# Patient Record
Sex: Male | Born: 1965 | Race: White | Hispanic: No | Marital: Married | Smoking: Never smoker
Health system: Southern US, Community
[De-identification: ages and names within clinical notes are randomized; demographics above are authoritative.]

## PROBLEM LIST (undated history)

## (undated) DIAGNOSIS — I10 Essential (primary) hypertension: Secondary | ICD-10-CM

## (undated) DIAGNOSIS — K219 Gastro-esophageal reflux disease without esophagitis: Secondary | ICD-10-CM

## (undated) DIAGNOSIS — E119 Type 2 diabetes mellitus without complications: Secondary | ICD-10-CM

## (undated) DIAGNOSIS — E785 Hyperlipidemia, unspecified: Secondary | ICD-10-CM

## (undated) DIAGNOSIS — J302 Other seasonal allergic rhinitis: Secondary | ICD-10-CM

## (undated) DIAGNOSIS — G4733 Obstructive sleep apnea (adult) (pediatric): Secondary | ICD-10-CM

## (undated) HISTORY — DX: Morbid (severe) obesity due to excess calories: E66.01

## (undated) HISTORY — PX: NASAL SEPTUM SURGERY: SHX37

## (undated) HISTORY — DX: Other seasonal allergic rhinitis: J30.2

## (undated) HISTORY — PX: OTHER SURGICAL HISTORY: SHX169

## (undated) HISTORY — DX: Hyperlipidemia, unspecified: E78.5

## (undated) HISTORY — DX: Gastro-esophageal reflux disease without esophagitis: K21.9

## (undated) HISTORY — DX: Obstructive sleep apnea (adult) (pediatric): G47.33

## (undated) HISTORY — DX: Type 2 diabetes mellitus without complications: E11.9

## (undated) HISTORY — PX: CHOLECYSTECTOMY: SHX55

## (undated) HISTORY — DX: Essential (primary) hypertension: I10

---

## 2000-04-17 ENCOUNTER — Encounter: Payer: Self-pay | Admitting: Orthopedic Surgery

## 2000-04-17 ENCOUNTER — Emergency Department (HOSPITAL_COMMUNITY): Admission: EM | Admit: 2000-04-17 | Discharge: 2000-04-17 | Payer: Self-pay | Admitting: Emergency Medicine

## 2004-10-29 ENCOUNTER — Inpatient Hospital Stay: Payer: Self-pay | Admitting: Otolaryngology

## 2005-04-21 ENCOUNTER — Ambulatory Visit: Payer: Self-pay | Admitting: Otolaryngology

## 2008-03-07 ENCOUNTER — Emergency Department (HOSPITAL_COMMUNITY): Admission: EM | Admit: 2008-03-07 | Discharge: 2008-03-07 | Payer: Self-pay | Admitting: Emergency Medicine

## 2008-05-14 ENCOUNTER — Emergency Department (HOSPITAL_COMMUNITY): Admission: EM | Admit: 2008-05-14 | Discharge: 2008-05-14 | Payer: Self-pay | Admitting: Emergency Medicine

## 2008-09-11 ENCOUNTER — Encounter (INDEPENDENT_AMBULATORY_CARE_PROVIDER_SITE_OTHER): Payer: Self-pay | Admitting: General Surgery

## 2008-09-11 ENCOUNTER — Ambulatory Visit (HOSPITAL_COMMUNITY): Admission: RE | Admit: 2008-09-11 | Discharge: 2008-09-12 | Payer: Self-pay | Admitting: General Surgery

## 2009-10-01 IMAGING — CR DG CHEST 1V PORT
1 series · 1 of 1 positions shown · non-contrast
Comparison: None

CLINICAL DATA: Chest pain.

PORTABLE CHEST - 1 VIEW

[view not recorded]
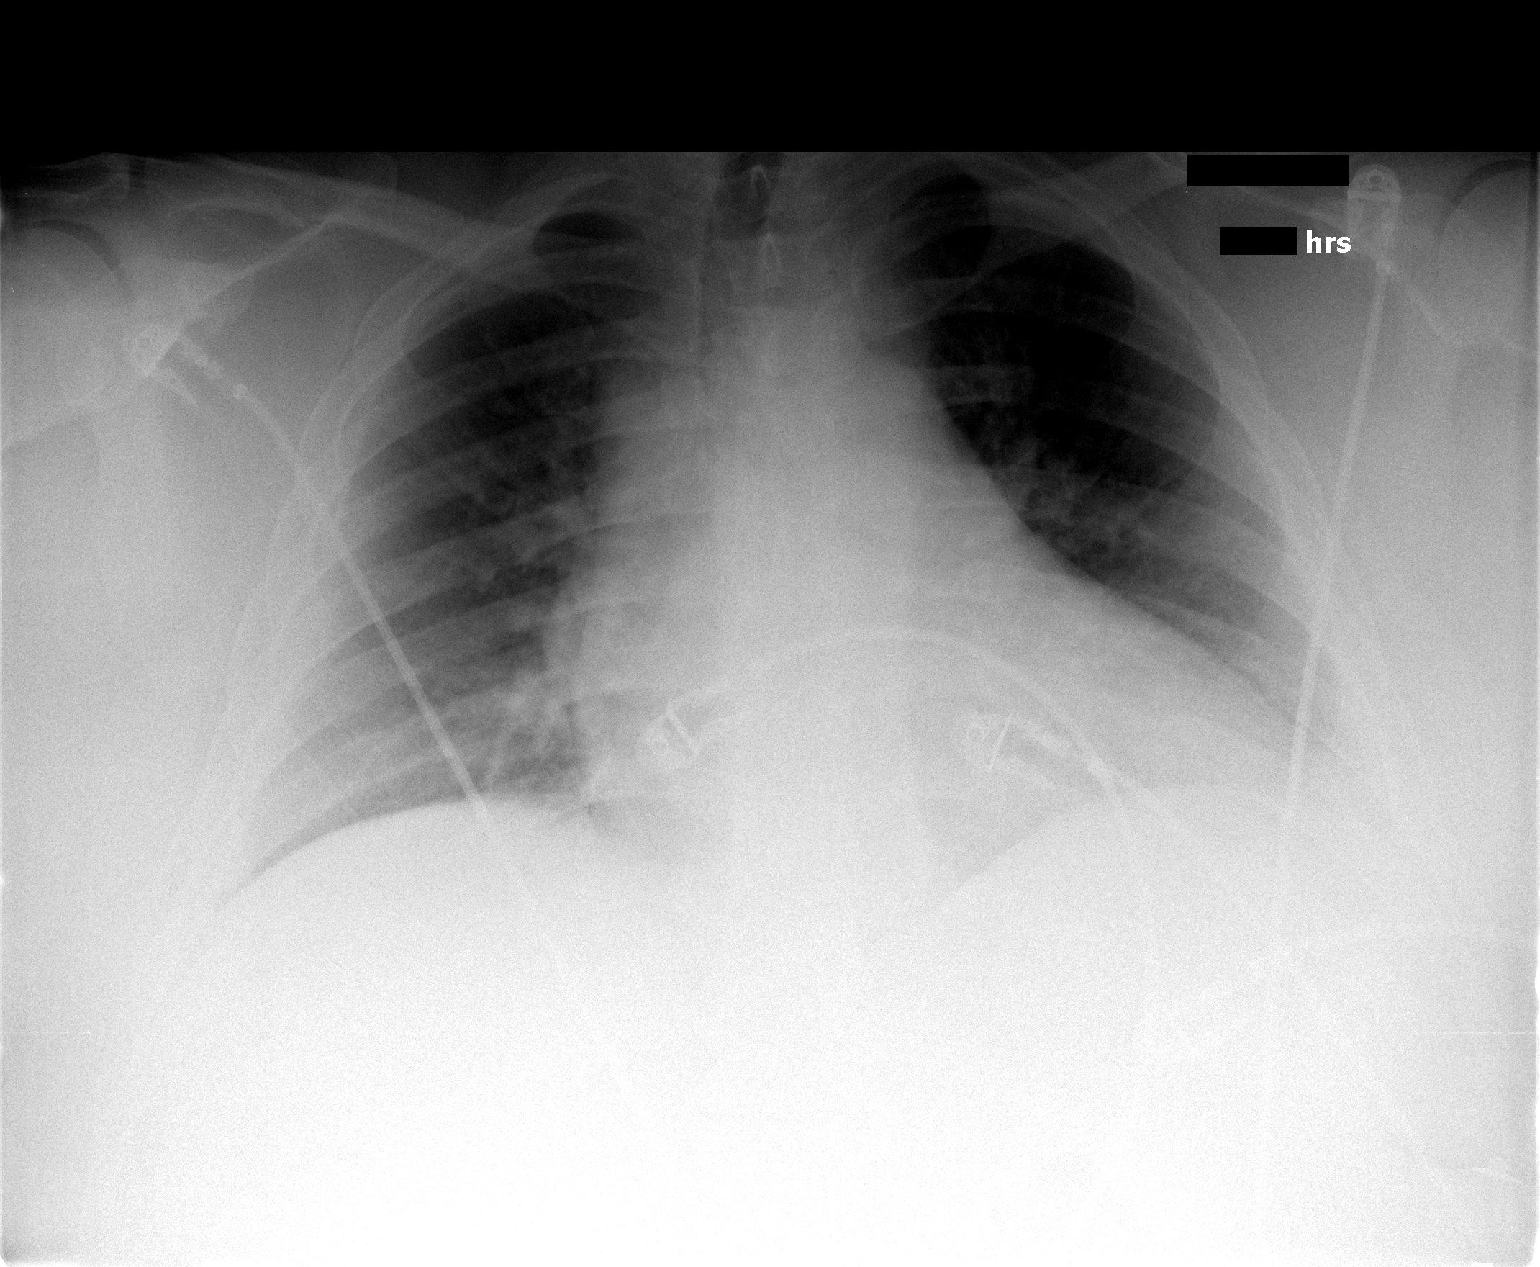

[1 of 1 positions shown; findings below may reference images not displayed]

FINDINGS: Trachea is midline.  Heart size is accentuated by low
lung volumes and AP technique.  Lungs are clear.
IMPRESSION: No acute findings.

## 2009-12-08 IMAGING — US US ABDOMEN COMPLETE
1 series · 14 of 25 positions shown · non-contrast
Comparison: None

CLINICAL DATA: Chest and abdomen pain.  Nausea.

ABDOMEN ULTRASOUND
TECHNIQUE: Complete abdominal ultrasound examination was performed
including evaluation of the liver, gallbladder, bile ducts,
pancreas, kidneys, spleen, IVC, and abdominal aorta.

[Series 1: unknown · 0.37mm/px · 14 of 66 slices shown]
[im 1/66]
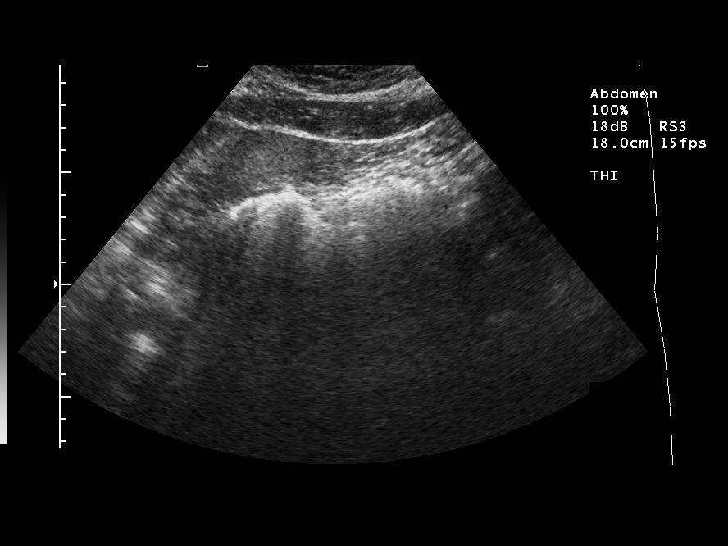
[im 6/66]
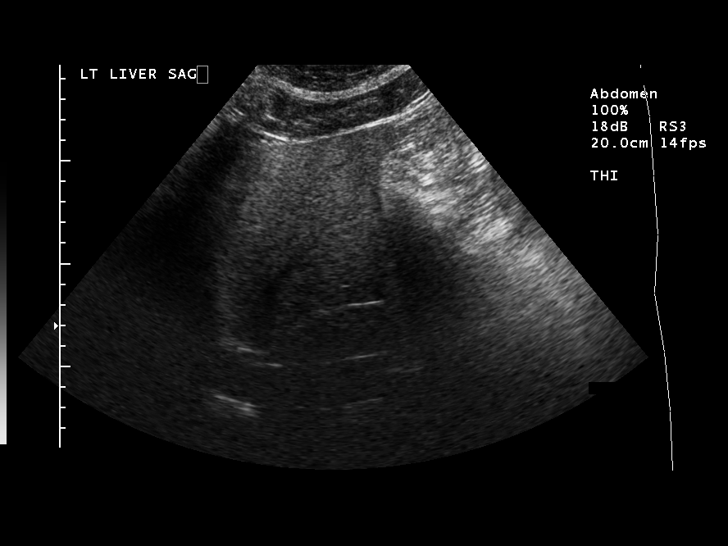
[im 11/66]
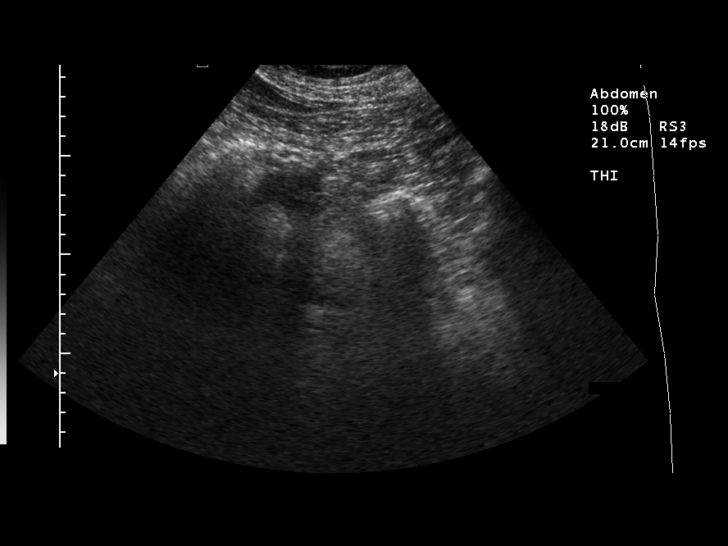
[im 17/66]
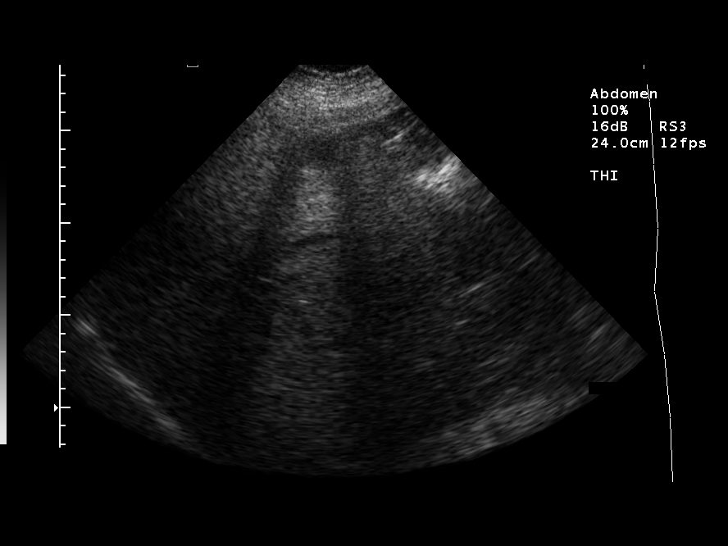
[im 22/66]
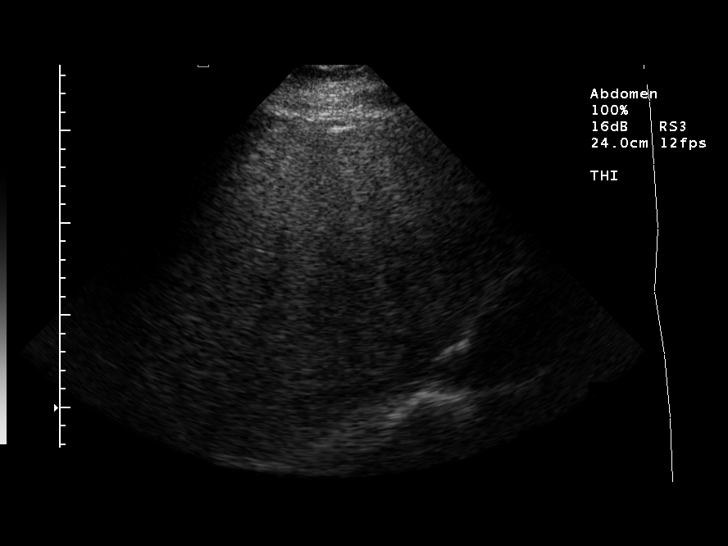
[im 25/66]
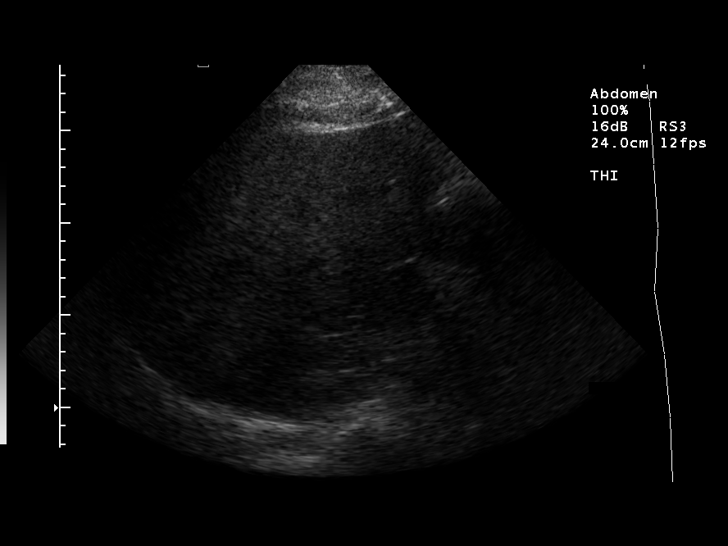
[im 30/66]
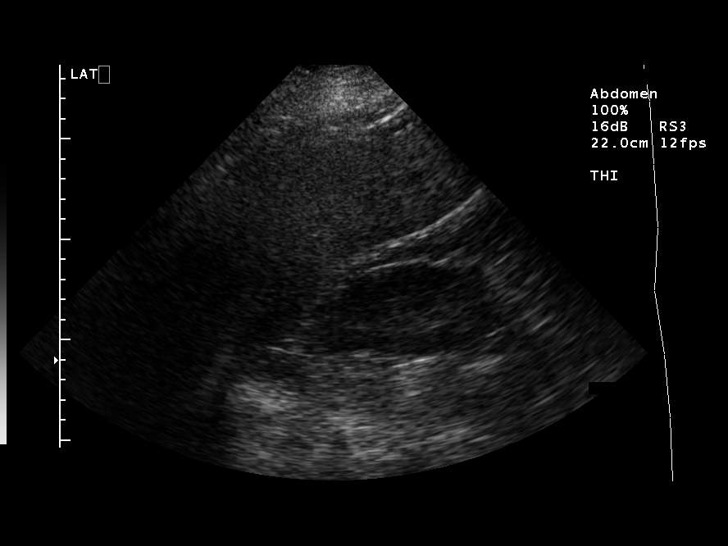
[im 36/66]
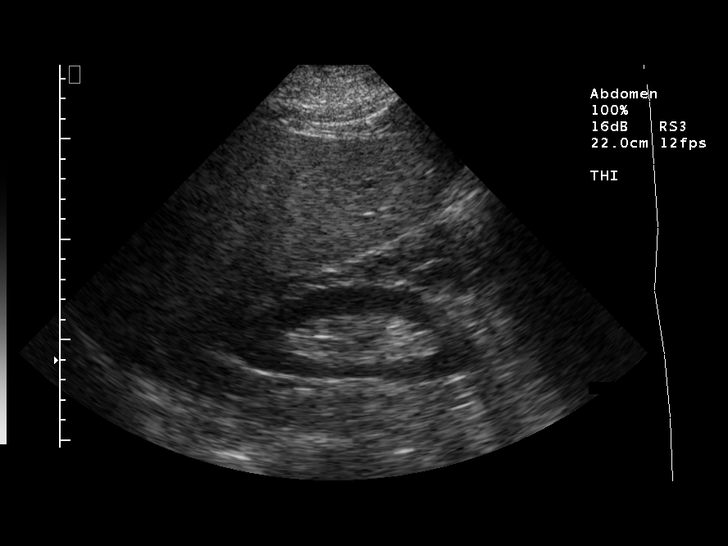
[im 41/66]
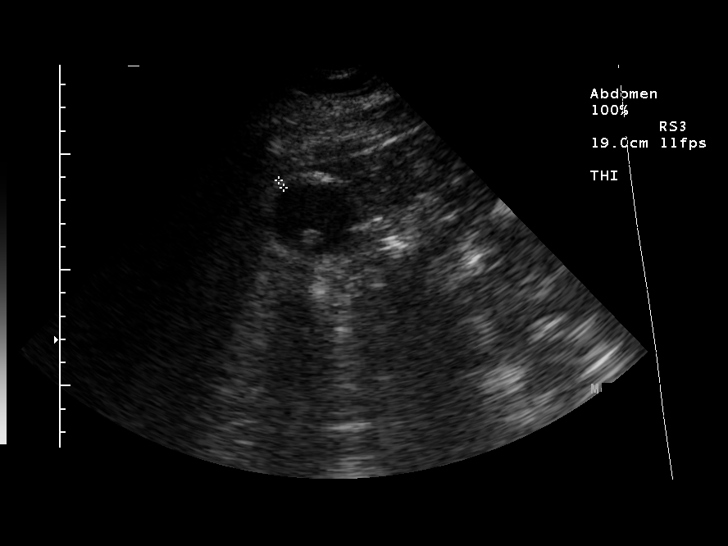
[im 44/66]
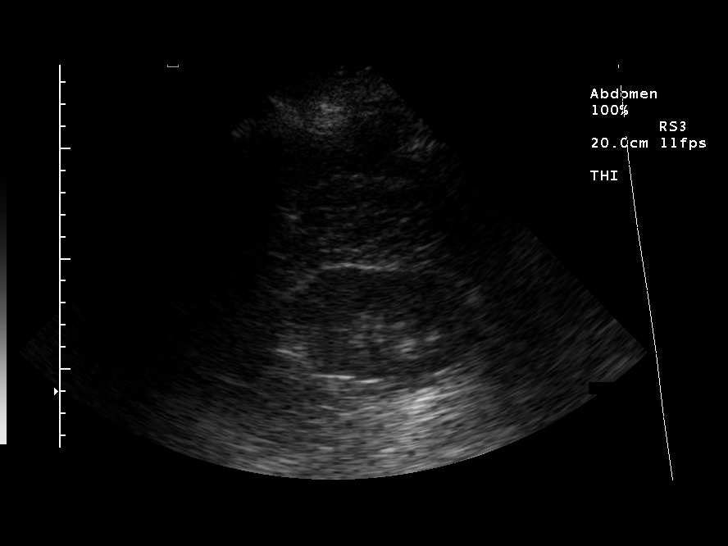
[im 49/66]
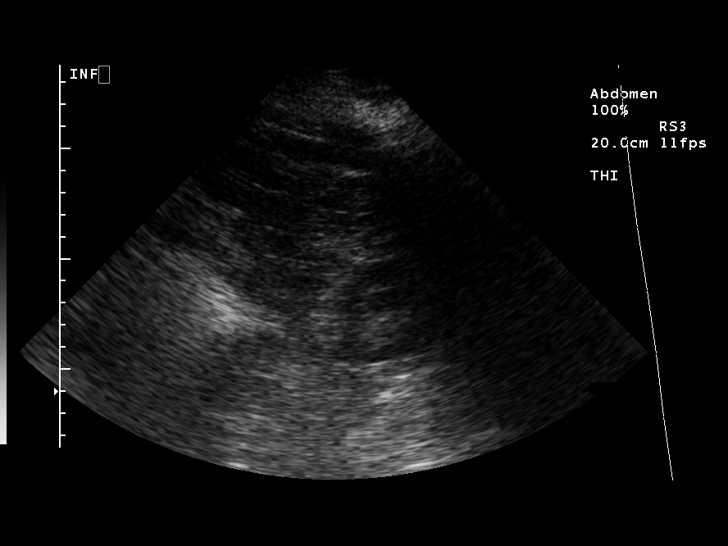
[im 55/66]
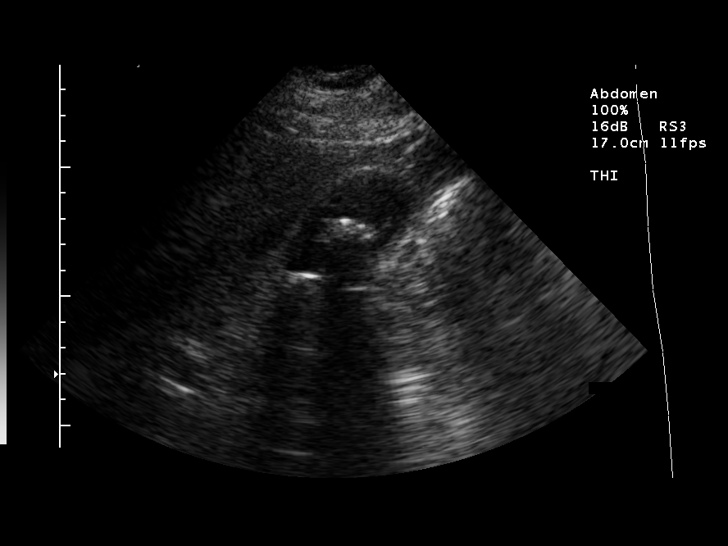
[im 60/66]
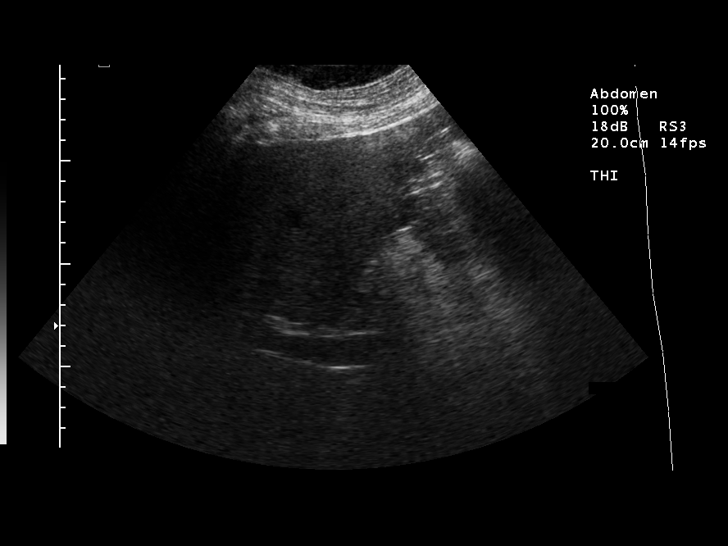
[im 66/66]
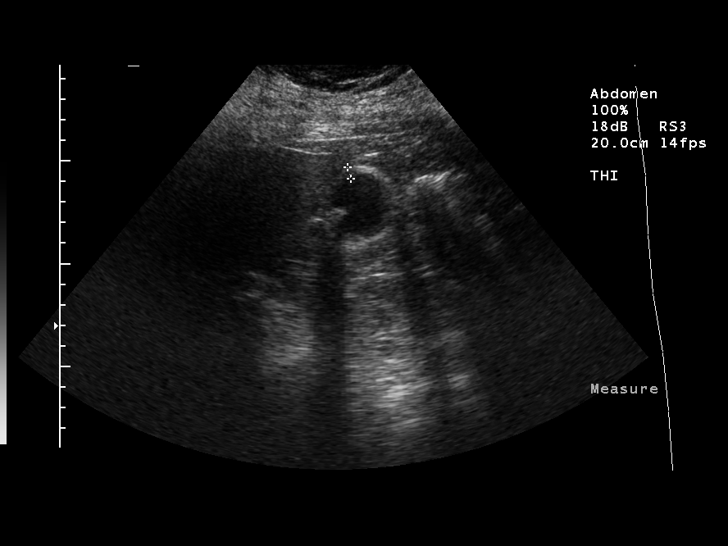

[14 of 25 positions shown; findings below may reference images not displayed]

FINDINGS: There are multiple gallstones.  The gallbladder wall is
slightly thickened to 6 mm.  No pericholecystic fluid.  No dilated
bile ducts.  The proximal common bile duct is 3.8 mm in diameter.

There is increased echogenicity of the liver parenchyma consistent
with fatty infiltration and there is mild hepatomegaly.

The inferior vena cava, spleen, and kidneys are normal.  Right
kidney is 11.9 cm in length and the left kidney is 12.0 cm in
length.

The pancreas and aorta are obscured by bowel gas.
IMPRESSION: Multiple gallstones with slight thickening of the gallbladder wall.
This could be seen with acute or chronic cholecystitis.  Mild
hepatomegaly  with a fatty liver.

## 2011-03-31 NOTE — Op Note (Signed)
NAMEJERRID, Hector Powell          ACCOUNT NO.:  [id number]   MEDICAL RECORD NO.:  [id number]          PATIENT TYPE:  OIB   LOCATION:  5032                         FACILITY:  [facility]   PHYSICIAN:  Cherylynn Ridges, M.D.    DATE OF BIRTH:  08-Oct-1966   DATE OF PROCEDURE:  09/11/2008  DATE OF DISCHARGE:                               OPERATIVE REPORT   PREOPERATIVE DIAGNOSIS:  Symptomatic cholelithiasis.   POSTOPERATIVE DIAGNOSIS:  Symptomatic cholelithiasis.   PROCEDURE:  Laparoscopic cholecystectomy with cholangiogram.   SURGEON:  Cherylynn Ridges, MD   ASSISTANT:  Currie Paris, MD   ANESTHESIA:  General endotracheal.   ESTIMATED BLOOD LOSS:  Less than 20 mL.   COMPLICATIONS:  None.   CONDITION:  Stable.   FINDINGS:  Two large stones in the gallbladder with normal  intraoperative cholangiogram.   INDICATIONS FOR OPERATION:  The patient is a 45 year old with  symptomatic gallstones who comes in for an elective laparoscopic  cholecystectomy.   OPERATION:  The patient was taken to the operating room and placed on  table in supine position.  After an adequate general endotracheal  anesthetic was administered, he was prepped and draped in the usual  sterile manner exposing the midline.   A supraumbilical midline incision longitudinally was made using a 15  blade and taken down to the midline fascia.  We incised the midline  fascia with a 15 blade and immediately went to the peritoneal cavity  with minimal difficulty.  We grabbed the edges with Kocher clamps and  passed a pursestring suture of 0 Vicryl around the fascial opening.  Then  secured in Hasson cannula which was subsequently passed and  secured in place with a pursestring suture.   A large and long instruments were used because of the patient's large  size; however, these were likely not needed as the patient, although in  spite of his large size was not difficult and not very thick.   With the patient's Hasson  cannula in place insufflated carbon dioxide  gas up to maximal intra-abdominal pressure of 15  mmHg.  He was placed  in reverse Trendelenburg, the left side was tilted down.  Two right-  sided, 5-mm cannulas and a subxiphoid 12-mm cannula was passed under  direct vision.  With all cannulas in place, the dissection was begun.   We grabbed the dome of the gallbladder using a ratcheted grasper through  the lateral most 5-mm cannula and retracted towards the anterior  abdominal wall in the right upper quadrant.  A second grasper was passed  onto the infundibulum.  We opened up the peritoneum overlying the  triangle of Fallot and hepatic duodenal triangle, isolating out the  cystic duct and cystic artery.  The cystic artery was clipped proximally  and distally x2 and then transected.  A clip was placed along the  gallbladder side of the cystic duct and a choledochotomy made using  laparoscopic scissors.  A Cook catheter was passed through the anterior  abdominal wall and then into the choledochotomy and secured in place  with a clip.  A cholangiogram was performed which  showed good flow into  the duodenum, good proximal filling, no intraductal filling defects, and  no dilatation.   The clip was removed from the catheter, the catheter was removed, and  the distal cystic duct was clipped x3 and then transcystic duct  transected.   We dissected out the gallbladder from its bed with minimal difficulty.  There was minimal bleeding.  We removed it from the supraumbilical site  using an EndoCatch bag.  Two large stones were noted.  We closed off the  supraumbilical fascia using the pursestring suture which was in place.  There was no leakage of gas as we tied off the suture.  We irrigated  above the liver and gallbladder bed where there was adequate hemostasis.   All fluid and gas were removed from above the liver.  We removed all  cannulas and then closed.   Each incision was injected with  0.25% Marcaine with epi, then we closed  the skin at the subxiphoid and the supraumbilical site using a running  subcuticular stitch of 4-0 Vicryl.  Dermabond was used close the lateral  cannula sites.  Dermabond, Steri-Strips, and Tegaderm was used at all  sites.  All needle counts, sponge counts, and instrument counts were  correct.      Cherylynn Ridges, M.D.  Electronically Signed     JOW/MEDQ  D:  09/11/2008  T:  09/11/2008  Job:  [id number]

## 2011-08-11 LAB — POCT CARDIAC MARKERS
Myoglobin, poc: 130
Myoglobin, poc: 70.5
Operator id: 288331
Operator id: 290111
Troponin i, poc: <0.05

## 2011-08-11 LAB — POCT I-STAT, CHEM 8
HCT: 42
Hemoglobin: 14.3
Sodium: 139
TCO2: 27

## 2011-08-13 LAB — COMPREHENSIVE METABOLIC PANEL
AST: 16
Albumin: 3.9
CO2: 31
Calcium: 9.1
Creatinine, Ser: 1.17
GFR calc Af Amer: >60
GFR calc non Af Amer: >60

## 2011-08-13 LAB — CBC
MCHC: 33.9
MCV: 86
Platelets: 214
RDW: 13

## 2011-08-13 LAB — DIFFERENTIAL
Eosinophils Relative: 2
Lymphocytes Relative: 19
Lymphs Abs: 1.3

## 2011-08-13 LAB — POCT CARDIAC MARKERS
CKMB, poc: 1.2
CKMB, poc: 1.2
Troponin i, poc: <0.05

## 2011-08-13 LAB — LIPASE, BLOOD: Lipase: 13

## 2011-08-17 LAB — COMPREHENSIVE METABOLIC PANEL WITH GFR
ALT: 20
AST: 19
Albumin: 3.7
Alkaline Phosphatase: 61
BUN: 12
CO2: 31
Calcium: 9.2
Chloride: 104
Creatinine, Ser: 0.97
GFR calc non Af Amer: >60
Glucose, Bld: 124 — ABNORMAL HIGH
Potassium: 4.8
Sodium: 139
Total Bilirubin: 1.2
Total Protein: 6.3

## 2011-08-17 LAB — DIFFERENTIAL
Lymphs Abs: 2.2
Monocytes Relative: 8
Neutro Abs: 3.8
Neutrophils Relative %: 57

## 2011-08-17 LAB — CBC
HCT: 43
Hemoglobin: 14.8
MCHC: 34.5
MCV: 86.1
Platelets: 211
RBC: 5
RDW: 12.9
WBC: 6.6

## 2011-08-17 LAB — GLUCOSE, CAPILLARY: Glucose-Capillary: 161 — ABNORMAL HIGH

## 2012-04-07 ENCOUNTER — Encounter: Payer: Self-pay | Admitting: *Deleted

## 2012-04-07 DIAGNOSIS — J302 Other seasonal allergic rhinitis: Secondary | ICD-10-CM | POA: Insufficient documentation

## 2012-04-07 DIAGNOSIS — E785 Hyperlipidemia, unspecified: Secondary | ICD-10-CM | POA: Insufficient documentation

## 2012-04-07 DIAGNOSIS — G4733 Obstructive sleep apnea (adult) (pediatric): Secondary | ICD-10-CM | POA: Insufficient documentation

## 2012-04-07 DIAGNOSIS — R079 Chest pain, unspecified: Secondary | ICD-10-CM | POA: Insufficient documentation

## 2012-04-07 DIAGNOSIS — K219 Gastro-esophageal reflux disease without esophagitis: Secondary | ICD-10-CM | POA: Insufficient documentation

## 2012-09-22 ENCOUNTER — Encounter: Payer: Self-pay | Admitting: *Deleted

## 2015-06-03 ENCOUNTER — Encounter: Payer: Self-pay | Admitting: Emergency Medicine

## 2015-06-03 ENCOUNTER — Ambulatory Visit
Admission: EM | Admit: 2015-06-03 | Discharge: 2015-06-03 | Disposition: A | Discharge status: Home / Self Care | Payer: BC Managed Care – PPO | Attending: Internal Medicine | Admitting: Internal Medicine

## 2015-06-03 DIAGNOSIS — L255 Unspecified contact dermatitis due to plants, except food: Secondary | ICD-10-CM | POA: Diagnosis not present

## 2015-06-03 MED ORDER — PREDNISONE 20 MG PO TABS: 60.0000 mg | ORAL_TABLET | Freq: Every day | ORAL | Status: DC

## 2015-06-03 NOTE — ED Provider Notes (Signed)
CSN: [id number]     Arrival date & time 06/03/15  0701 History   First MD Initiated Contact with Patient 06/03/15 [phone]     Chief Complaint  Patient presents with  . 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  Rash started at sock line thought it was chiggers but then spread up leg, blisters, itching has tried Psychiatric nurse which helps with itching but has not stopped spread of rash up legs to knees now.  Patient is a 49 y.o. male presenting with rash. The history is provided by the patient.  Rash Location:  Leg Leg rash location:  L lower leg and R lower leg Quality: blistering, bruising and itchiness   Quality: not burning, not draining, not dry, not painful, not peeling, not red, not scaling, not swelling and not weeping   Severity:  Moderate Onset quality:  Sudden Duration:  1 week Timing:  Constant Progression:  Spreading Chronicity:  New Context: plant contact   Context: not animal contact, not chemical exposure, not diapers, not eggs, not exposure to similar rash, not food, not hot tub use, not insect bite/sting, not medications, not new detergent/soap, not nuts, not pollen, not pregnancy, not sick contacts and not sun exposure   Relieved by:  Anti-itch cream Associated symptoms: no abdominal pain, no diarrhea, no fatigue, no fever, no headaches, no hoarse voice, no induration, no joint pain, no myalgias, no nausea, no periorbital edema, no shortness of breath, no sore throat, no throat swelling, no tongue swelling, no URI, not vomiting and not wheezing     Past Medical History  Diagnosis Date  . Obstructive sleep apnea   . Obesity, morbid   . GERD (gastroesophageal reflux disease)   . Dyslipidemia   . Chest pain 2009    normal  stress echo  . Hypertension   . Seasonal allergies    Past Surgical History  Procedure Laterality Date  . Nasal septum surgery    . Other surgical history      tonsilectomy  . Other surgical history      s/p palato-uvuloplasty  . Cholecystectomy     History reviewed. No pertinent family history. History  Substance Use Topics  . Smoking status: Never Smoker   . Smokeless tobacco: Not on file  . Alcohol Use: No    Review of Systems  Constitutional: Negative for fever, chills, diaphoresis, activity change, appetite change and fatigue.  HENT: Negative for hoarse voice, mouth sores, nosebleeds, sore throat, trouble swallowing and voice change.   Eyes: Negative for photophobia, pain, discharge, redness, itching and visual disturbance.  Respiratory: Negative for shortness of breath, wheezing and stridor.   Cardiovascular: Negative for chest pain, palpitations and leg swelling.  Gastrointestinal: Negative for nausea, vomiting, abdominal pain, diarrhea, constipation and blood in stool.  Endocrine: Negative for cold intolerance and heat intolerance.  Genitourinary: Negative for dysuria.  Musculoskeletal: Negative for myalgias, back pain, joint swelling, arthralgias, gait problem, neck pain and neck stiffness.  Skin: Positive for rash. Negative for color change, pallor and wound.  Allergic/Immunologic: Positive for environmental allergies. Negative for food allergies.  Neurological: Negative for dizziness, tremors, seizures, syncope, facial asymmetry, speech difficulty, weakness, light-headedness, numbness and headaches.  Hematological: Negative for adenopathy. Does not bruise/bleed easily.  Psychiatric/Behavioral: Negative for behavioral problems, confusion, sleep disturbance and agitation.  Allergies  Review of patient's allergies indicates no known allergies.  Home Medications   Prior to Admission medications   Medication Sig Start Date End Date Taking? Authorizing Provider    famotidine (PEPCID) 40 MG tablet Take 40 mg by mouth daily.   Yes Historical Provider, MD  metFORMIN (GLUCOPHAGE) 500 MG tablet Take 500 mg by mouth daily with breakfast.   Yes Historical Provider, MD  aspirin 81 MG tablet Take 81 mg by mouth daily.    Historical Provider, MD  fexofenadine (ALLEGRA) 180 MG tablet Take 180 mg by mouth daily.    Historical Provider, MD  niacin 500 MG tablet Take 500 mg by mouth daily with breakfast.    Historical Provider, MD  predniSONE (DELTASONE) 20 MG tablet Take 3 tablets (60 mg total) by mouth daily with breakfast. Then 40mg  4d then 30mg  4d then 20mg  4d then 10mg  4d 06/03/15   Barbaraann Barthelina A Betancourt, NP  simvastatin (ZOCOR) 20 MG tablet Take 20 mg by mouth every evening.    Historical Provider, MD   BP 142/61 mmHg  Pulse 60  Temp(Src) 96.9 F (36.1 C) (Tympanic)  Resp 16  Ht 5\' 10"  (1.778 m)  Wt 375 lb (170.099 kg)  BMI 53.81 kg/m2  SpO2 98% Physical Exam  Constitutional: He is oriented to person, place, and time. Vital signs are normal. He appears well-developed and well-nourished. No distress.  HENT:  Head: Normocephalic and atraumatic.  Right Ear: External ear normal.  Left Ear: External ear normal.  Nose: Nose normal.  Mouth/Throat: Oropharynx is clear and moist. No oropharyngeal exudate.  Eyes: Conjunctivae, EOM and lids are normal. Pupils are equal, round, and reactive to light. Right eye exhibits no discharge. Left eye exhibits no discharge. No scleral icterus.  Neck: Trachea normal and normal range of motion. Neck supple. No tracheal deviation present. No thyromegaly present.  Cardiovascular: Normal rate, regular rhythm, normal heart sounds and intact distal pulses.  Exam reveals no gallop and no friction rub.   No murmur heard. Pulmonary/Chest: Effort normal and breath sounds normal. No respiratory distress. He has no wheezes. He has no rales.  Abdominal: Soft. Bowel sounds are normal. There is no tenderness.  Musculoskeletal: Normal range of  motion. He exhibits no edema or tenderness.  Lymphadenopathy:    He has no cervical adenopathy.  Neurological: He is alert and oriented to person, place, and time. He exhibits normal muscle tone. Coordination normal.  Skin: Skin is warm, dry and intact. Rash noted. No abrasion, no bruising, no burn, no ecchymosis, no laceration, no lesion, no petechiae and no purpura noted. Rash is macular, papular, maculopapular, vesicular and urticarial. Rash is not nodular and not pustular. He is not diaphoretic. No cyanosis or erythema. No pallor. Nails show no clubbing.     Patient had applied calamine lotion to bilateral lower legs this am/ivarest still present  Psychiatric: He has a normal mood and affect. His speech is normal and behavior is normal. Judgment and thought content normal. Cognition and memory are normal.  Nursing note and vitals reviewed.   ED Course  Procedures (including critical care time) Labs Review Labs Reviewed - No data to display  Imaging Review No results found.   MDM   1. Contact dermatitis due to plant    Medication as directed.  Symptomatic therapy suggested e.g. Calamine lotion, zyrtec 10mg  po qam or benadryl 25-50mg  po bedtime prn breakthrough itching.  Patient preferred po prednisone taper.  Discussed washing all sheets/towels at least weekly or  when noticeable soiling.  May use ice if breakthrough itching.  Warm to cool water soaks and/or oatmeal baths.  Call or return to clinic as needed if these symptoms worsen or fail to improve as anticipated especially lesions noted on eye, visual changes or visual loss.  Exitcare handout on contact dermatitis, poison ivy, poison oak and cellulitis given to patient.  Discussed avoidance/no contact wear of long sleeves/pants/socks/gloves and handkerchief around neck/mouth/face and use of poison ivy block cream along with tepid shower immediately after completion of yard work.  Avoid scratching lesions to prevent secondary  infections.   Patient verbalized agreement and understanding of treatment plan and had no further questions at this time.      Barbaraann Barthel, NP 06/03/15 [phone]

## 2015-06-03 NOTE — ED Notes (Signed)
Patient c/o itchy rash on both his lower legs for a week.

## 2015-06-03 NOTE — Discharge Instructions (Signed)
Poison Ivy °Poison ivy is a inflammation of the skin (contact dermatitis) caused by touching the allergens on the leaves of the ivy plant following previous exposure to the plant. The rash usually appears 48 hours after exposure. The rash is usually bumps (papules) or blisters (vesicles) in a linear pattern. Depending on your own sensitivity, the rash may simply cause redness and itching, or it may also progress to blisters which may break open. These must be well cared for to prevent secondary bacterial (germ) infection, followed by scarring. Keep any open areas dry, clean, dressed, and covered with an antibacterial ointment if needed. The eyes may also get puffy. The puffiness is worst in the morning and gets better as the day progresses. This dermatitis usually heals without scarring, within 2 to 3 weeks without treatment. °HOME CARE INSTRUCTIONS  °Thoroughly wash with soap and water as soon as you have been exposed to poison ivy. You have about one half hour to remove the plant resin before it will cause the rash. This washing will destroy the oil or antigen on the skin that is causing, or will cause, the rash. Be sure to wash under your fingernails as any plant resin there will continue to spread the rash. Do not rub skin vigorously when washing affected area. Poison ivy cannot spread if no oil from the plant remains on your body. A rash that has progressed to weeping sores will not spread the rash unless you have not washed thoroughly. It is also important to wash any clothes you have been wearing as these may carry active allergens. The rash will return if you wear the unwashed clothing, even several days later. °Avoidance of the plant in the future is the best measure. Poison ivy plant can be recognized by the number of leaves. Generally, poison ivy has three leaves with flowering branches on a single stem. °Diphenhydramine may be purchased over the counter and used as needed for itching. Do not drive with  this medication if it makes you drowsy.Ask your caregiver about medication for children. °SEEK MEDICAL CARE IF: °· Open sores develop. °· Redness spreads beyond area of rash. °· You notice purulent (pus-like) discharge. °· You have increased pain. °· Other signs of infection develop (such as fever). °Document Released: 10/30/2000 Document Revised: 01/25/2012 Document Reviewed: 04/12/2009 °ExitCare® Patient Information ©2015 [employer]. This information is not intended to replace advice given to you by your health care provider. Make sure you discuss any questions you have with your health care provider. ° °Poison Oak °Poison oak is an inflammation of the skin (contact dermatitis). It is caused by contact with the allergens on the leaves of the oak (toxicodendron) plants. Depending on your sensitivity, the rash may consist simply of redness and itching, or it may also progress to blisters which may break open (rupture). These must be well cared for to prevent secondary germ (bacterial) infection as these infections can lead to scarring. The eyes may also get puffy. The puffiness is worst in the morning and gets better as the day progresses. Healing is best accomplished by keeping any open areas dry, clean, covered with a bandage, and covered with an antibacterial ointment if needed. Without secondary infection, this dermatitis usually heals without scarring within 2 to 3 weeks without treatment. °HOME CARE INSTRUCTIONS °When you have been exposed to poison oak, it is very important to thoroughly wash with soap and water as soon as the exposure has been discovered. You have about one half hour to   remove the plant resin before it will cause the rash. This cleaning will quickly destroy the oil or antigen on the skin (the antigen is what causes the rash). Wash aggressively under the fingernails as any plant resin still there will continue to spread the rash. Do not rub skin vigorously when washing affected area.  Poison oak cannot spread if no oil from the plant remains on your body. Rash that has progressed to weeping sores (lesions) will not spread the rash unless you have not washed thoroughly. It is also important to clean any clothes you have been wearing as they may carry active allergens which will spread the rash, even several days later. Avoidance of the plant in the future is the best measure. Poison oak plants can be recognized by the number of leaves. Generally, poison oak has three leaves with flowering branches on a single stem. Diphenhydramine may be purchased over the counter and used as needed for itching. Do not drive with this medication if it makes you drowsy. Ask your caregiver about medication for children. SEEK IMMEDIATE MEDICAL CARE IF:   Open areas of the rash develop.  You notice redness extending beyond the area of the rash.  There is a pus like discharge.  There is increased pain.  Other signs of infection develop (such as fever). Document Released: 05/09/2003 Document Revised: 01/25/2012 Document Reviewed: 09/18/2009 [facility] Patient Information 2015 [place], [place]. This information is not intended to replace advice given to you by your health care provider. Make sure you discuss any questions you have with your health care provider.  Contact Dermatitis Contact dermatitis is a reaction to certain substances that touch the skin. Contact dermatitis can be either irritant contact dermatitis or allergic contact dermatitis. Irritant contact dermatitis does not require previous exposure to the substance for a reaction to occur.Allergic contact dermatitis only occurs if you have been exposed to the substance before. Upon a repeat exposure, your body reacts to the substance.  CAUSES  Many substances can cause contact dermatitis. Irritant dermatitis is most commonly caused by repeated exposure to mildly irritating substances, such  as:  Makeup.  Soaps.  Detergents.  Bleaches.  Acids.  Metal salts, such as nickel. Allergic contact dermatitis is most commonly caused by exposure to:  Poisonous plants.  Chemicals (deodorants, shampoos).  Jewelry.  Latex.  Neomycin in triple antibiotic cream.  Preservatives in products, including clothing. SYMPTOMS  The area of skin that is exposed may develop:  Dryness or flaking.  Redness.  Cracks.  Itching.  Pain or a burning sensation.  Blisters. With allergic contact dermatitis, there may also be swelling in areas such as the eyelids, mouth, or genitals.  DIAGNOSIS  Your caregiver can usually tell what the problem is by doing a physical exam. In cases where the cause is uncertain and an allergic contact dermatitis is suspected, a patch skin test may be performed to help determine the cause of your dermatitis. TREATMENT Treatment includes protecting the skin from further contact with the irritating substance by avoiding that substance if possible. Barrier creams, powders, and gloves may be helpful. Your caregiver may also recommend:  Steroid creams or ointments applied 2 times daily. For best results, soak the rash area in cool water for 20 minutes. Then apply the medicine. Cover the area with a plastic wrap. You can store the steroid cream in the refrigerator for a "chilly" effect on your rash. That may decrease itching. Oral steroid medicines may be needed in more severe cases.  Antibiotics or antibacterial ointments if a skin infection is present.  Antihistamine lotion or an antihistamine taken by mouth to ease itching.  Lubricants to keep moisture in your skin.  Burow's solution to reduce redness and soreness or to dry a weeping rash. Mix one packet or tablet of solution in 2 cups cool water. Dip a clean washcloth in the mixture, wring it out a bit, and put it on the affected area. Leave the cloth in place for 30 minutes. Do this as often as possible  throughout the day.  Taking several cornstarch or baking soda baths daily if the area is too large to cover with a washcloth. Harsh chemicals, such as alkalis or acids, can cause skin damage that is like a burn. You should flush your skin for 15 to 20 minutes with cold water after such an exposure. You should also seek immediate medical care after exposure. Bandages (dressings), antibiotics, and pain medicine may be needed for severely irritated skin.  HOME CARE INSTRUCTIONS  Avoid the substance that caused your reaction.  Keep the area of skin that is affected away from hot water, soap, sunlight, chemicals, acidic substances, or anything else that would irritate your skin.  Do not scratch the rash. Scratching may cause the rash to become infected.  You may take cool baths to help stop the itching.  Only take over-the-counter or prescription medicines as directed by your caregiver.  See your caregiver for follow-up care as directed to make sure your skin is healing properly. SEEK MEDICAL CARE IF:   Your condition is not better after 3 days of treatment.  You seem to be getting worse.  You see signs of infection such as swelling, tenderness, redness, soreness, or warmth in the affected area.  You have any problems related to your medicines. Document Released: 10/30/2000 Document Revised: 01/25/2012 Document Reviewed: 04/07/2011 [facility] Patient Information 2015 [place]. This information is not intended to replace advice given to you by your health care provider. Make sure you discuss any questions you have with your health care provider. Cellulitis Cellulitis is an infection of the skin and the tissue beneath it. The infected area is usually red and tender. Cellulitis occurs most often in the arms and lower legs.  CAUSES  Cellulitis is caused by bacteria that enter the skin through cracks or cuts in the skin. The most common types of bacteria that cause cellulitis are  staphylococci and streptococci. SIGNS AND SYMPTOMS   Redness and warmth.  Swelling.  Tenderness or pain.  Fever. DIAGNOSIS  Your health care provider can usually determine what is wrong based on a physical exam. Blood tests may also be done. TREATMENT  Treatment usually involves taking an antibiotic medicine. HOME CARE INSTRUCTIONS   Take your antibiotic medicine as directed by your health care provider. Finish the antibiotic even if you start to feel better.  Keep the infected arm or leg elevated to reduce swelling.  Apply a warm cloth to the affected area up to 4 times per day to relieve pain.  Take medicines only as directed by your health care provider.  Keep all follow-up visits as directed by your health care provider. SEEK MEDICAL CARE IF:   You notice red streaks coming from the infected area.  Your red area gets larger or turns dark in color.  Your bone or joint underneath the infected area becomes painful after the skin has healed.  Your infection returns in the same area or another area.  You notice  a swollen bump in the infected area.  You develop new symptoms.  You have a fever. SEEK IMMEDIATE MEDICAL CARE IF:   You feel very sleepy.  You develop vomiting or diarrhea.  You have a general ill feeling (malaise) with muscle aches and pains. MAKE SURE YOU:   Understand these instructions.  Will watch your condition.  Will get help right away if you are not doing well or get worse. Document Released: 08/12/2005 Document Revised: 03/19/2014 Document Reviewed: 01/18/2012 [facility] Patient Information 2015 [place], [place]. This information is not intended to replace advice given to you by your health care provider. Make sure you discuss any questions you have with your health care provider.

## 2017-04-27 ENCOUNTER — Encounter: Payer: Self-pay | Admitting: Gastroenterology

## 2017-05-20 ENCOUNTER — Encounter: Payer: Self-pay | Admitting: Gastroenterology

## 2017-05-20 ENCOUNTER — Ambulatory Visit (INDEPENDENT_AMBULATORY_CARE_PROVIDER_SITE_OTHER): Payer: BC Managed Care – PPO | Admitting: Gastroenterology

## 2017-05-20 VITALS — BP 108/66 | HR 58 | Ht 69.29 in | Wt 386.0 lb

## 2017-05-20 DIAGNOSIS — Z1211 Encounter for screening for malignant neoplasm of colon: Secondary | ICD-10-CM

## 2017-05-20 NOTE — Patient Instructions (Signed)
Recall stool test in one year per Dr. Adela LankArmbruster. Pt left without taking AVS.

## 2017-05-20 NOTE — Progress Notes (Signed)
HPI :  51 y/o male with history of obesity (BMI 56), OSA, HTN, HLD, DM here for a new patient consultation from Dr. Creola CornJohn Russo to discuss colon cancer screening, in light of his weight.    Labs from primary care: 04/16/2017: - CBC - Hgb 15.2,, HCT 44.1, WBC 5.9, plt 194  Hemosure (FIT) negative 04/23/2017  No prior colonoscopy. He denies any routine bowel problems. Occasional constipated stools, occasionally loose, which appears related to having used metformin for the past 2 years or so, and having cholecystectomy which led him to slightly irregular bowel habits but no persistent loose stools. No blood in the stools. He had stool test (FIT) on 04/23/2017 which was negative.   No FH of colon cancer known.  Mother had breast cancer, father had lung cancer.   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   Past Medical History:  Diagnosis Date  . Chest pain 2009   normal stress echo  . Diabetes mellitus without complication (HCC)   . Dyslipidemia   . GERD (gastroesophageal reflux disease)   . Hypertension   . Obesity, morbid (HCC)   . Obstructive sleep apnea   . Seasonal allergies      Past Surgical History:  Procedure Laterality Date  . CHOLECYSTECTOMY    . NASAL SEPTUM SURGERY    . OTHER SURGICAL HISTORY     tonsilectomy  . OTHER SURGICAL HISTORY     s/p palato-uvuloplasty   Family History  Problem Relation Age of Onset  . Breast cancer Mother   . Lung cancer Father   . Colon cancer Neg Hx   . Stomach cancer Neg Hx    Social History  Substance Use Topics  . Smoking status: Never Smoker  . Smokeless tobacco: Never Used  . Alcohol use No   Current Outpatient Prescriptions  Medication Sig Dispense Refill  . aspirin 81 MG tablet Take 81 mg by mouth daily.    . famotidine (PEPCID) 40 MG tablet Take 40 mg by mouth daily.    . fexofenadine (ALLEGRA) 180 MG tablet Take 180 mg by mouth daily.     . metFORMIN (GLUCOPHAGE) 500 MG tablet Take 500 mg by mouth daily with breakfast.    . niacin 500 MG tablet Take 500 mg by mouth daily with breakfast.    . simvastatin (ZOCOR) 20 MG tablet Take 20 mg by mouth every evening.     No current facility-administered medications for this visit.    No Known Allergies   Review of Systems: All systems reviewed and negative except where noted in HPI.   Labs reviewed from Dr. Ferd Hibbsusso's office as above   Physical Exam: BP 108/66   Pulse (!) 58   Ht 5' 9.29" (1.76 m)   Wt (!) 386 lb (175.1 kg)   BMI 56.52 kg/m  Constitutional: Pleasant,well-developed, male in no acute distress. HEENT: Normocephalic and atraumatic. Conjunctivae are normal. No scleral icterus. Neck supple.  Cardiovascular: Normal rate, regular rhythm.  Pulmonary/chest: Effort normal and breath sounds normal. No wheezing, rales or rhonchi. Abdominal: Soft, protuberant / obese abdomen, nontender.  There are no masses palpable. No hepatomegaly. Extremities: no edema Lymphadenopathy: No cervical adenopathy noted. Neurological: Alert and oriented to person place and time. Skin: Skin is warm and dry. No rashes noted. Psychiatric: Normal mood and affect. Behavior is normal.   ASSESSMENT AND PLAN: 51 year old male with morbid obesity and other  medical problems as outlined above, here to discuss colon cancer screening in light of his obesity and OSA.   He is average risk for colon cancer, no anemia, no blood in the stools or alarm symptoms. He is recently had a negative FIT test.  I explained to him what stool FIT test was, and that a negative result is reassuring. We discussed stool based testing in general versus optical colonoscopy in regards to colon cancer screening options. Given his OSA and morbid obesity he is higher than average risk for anesthesia, and if he wishes to have an optical colonoscopy it would need to be done at the hospital.   Given he has a negative FIT, this  is valid for one year if he chooses to continue with stool based testing for his screening. We discussed that I think it unlikely he has any large polyps given this result however acknowledging that the test is not 100% accurate. Given his negative stool test and his increased risk for anesthesia related complications, he wishes to avoid colonoscopy moving forward, which is reasonable. Recommend that if he wishes to continue stool based testing for his screening modality, to repeat it in one year. If negative, continue it once yearly for screening. If positive, he would need a colonoscopy to further evaluate. If he changes his mind and wishes to have a colonoscopy for screening purposes, he should contact me and forgo further stool testing in that scenario. He agreed with the plan. Recommend daily fiber supplement in the interim to help normalize bowels.   Ileene Patrick, MD [facility] Pager [phone]  CC: Creola Corn, MD

## 2017-06-23 ENCOUNTER — Encounter: Payer: BC Managed Care – PPO | Admitting: Gastroenterology

## 2017-11-18 ENCOUNTER — Ambulatory Visit: Payer: BC Managed Care – PPO | Attending: Otolaryngology

## 2017-11-18 DIAGNOSIS — G4733 Obstructive sleep apnea (adult) (pediatric): Secondary | ICD-10-CM | POA: Diagnosis present

## 2017-11-18 DIAGNOSIS — Z6841 Body Mass Index (BMI) 40.0 and over, adult: Secondary | ICD-10-CM | POA: Insufficient documentation

## 2017-11-18 DIAGNOSIS — E119 Type 2 diabetes mellitus without complications: Secondary | ICD-10-CM | POA: Insufficient documentation

## 2017-11-18 DIAGNOSIS — E785 Hyperlipidemia, unspecified: Secondary | ICD-10-CM | POA: Insufficient documentation

## 2018-04-22 ENCOUNTER — Telehealth: Payer: Self-pay | Admitting: *Deleted

## 2018-04-22 DIAGNOSIS — Z1211 Encounter for screening for malignant neoplasm of colon: Secondary | ICD-10-CM

## 2018-04-22 NOTE — Telephone Encounter (Signed)
Dr Adela LankArmbruster-  Please see your note from 05/20/17. Do you want patient to have repeat hemocults or cologuard? I wanted to be sure of which you were referring to prior to calling patient.Marland Kitchen.Marland Kitchen..Marland Kitchen

## 2018-04-22 NOTE — Telephone Encounter (Signed)
FIT testing ordered. Patient will need to pick this up in the lab. Left voicemail for patient to call back.

## 2018-04-22 NOTE — Telephone Encounter (Signed)
-----   Message from Lesly RubensteinWendy L Robinson, [place] sent at 04/22/2018 10:31 AM EDT ----- Vanessa KickJan, This patient has a recall for a stool study in July 2019.   Thanks, Toniann FailWendy

## 2018-04-22 NOTE — Telephone Encounter (Signed)
Thanks for asking. FIT test. Thanks

## 2018-04-25 NOTE — Telephone Encounter (Signed)
Attempted to reach patient but got busy signal x 2. Will attempt to reach again at a later time.

## 2018-04-26 NOTE — Telephone Encounter (Signed)
Called and LM on pt's mobile number [phone] to call back regarding FIT test.  Needs to go to lab to pick up the materials to do stool test.

## 2018-04-27 NOTE — Telephone Encounter (Signed)
Called and Left another message for pt about FIT test needing to be done.  Mailing pt letter with the information as well.

## 2020-01-13 ENCOUNTER — Ambulatory Visit: Payer: BC Managed Care – PPO

## 2022-02-24 ENCOUNTER — Other Ambulatory Visit: Payer: Self-pay | Admitting: Registered Nurse

## 2022-02-24 ENCOUNTER — Other Ambulatory Visit (HOSPITAL_BASED_OUTPATIENT_CLINIC_OR_DEPARTMENT_OTHER): Payer: Self-pay | Admitting: Registered Nurse

## 2022-02-24 ENCOUNTER — Ambulatory Visit (HOSPITAL_COMMUNITY)
Admission: RE | Admit: 2022-02-24 | Discharge: 2022-02-24 | Disposition: A | Discharge status: Home / Self Care | Payer: BC Managed Care – PPO | Source: Ambulatory Visit | Attending: Registered Nurse | Admitting: Registered Nurse

## 2022-02-24 ENCOUNTER — Other Ambulatory Visit (HOSPITAL_COMMUNITY): Payer: Self-pay | Admitting: Registered Nurse

## 2022-02-24 DIAGNOSIS — R1032 Left lower quadrant pain: Secondary | ICD-10-CM | POA: Diagnosis present

## 2022-02-24 DIAGNOSIS — R112 Nausea with vomiting, unspecified: Secondary | ICD-10-CM

## 2022-02-24 MED ORDER — IOHEXOL 9 MG/ML PO SOLN
1000.0000 mL | ORAL | Status: AC
  Administered 2022-02-24: 1000 mL via ORAL

## 2022-02-24 MED ORDER — IOHEXOL 300 MG/ML  SOLN
100.0000 mL | Freq: Once | INTRAMUSCULAR | Status: AC | PRN
  Administered 2022-02-24: 100 mL via INTRAVENOUS

## 2022-03-03 ENCOUNTER — Encounter: Payer: Self-pay | Admitting: Gastroenterology

## 2022-03-17 ENCOUNTER — Other Ambulatory Visit: Payer: Self-pay

## 2022-03-19 ENCOUNTER — Ambulatory Visit (INDEPENDENT_AMBULATORY_CARE_PROVIDER_SITE_OTHER): Payer: BC Managed Care – PPO | Admitting: Gastroenterology

## 2022-03-19 ENCOUNTER — Encounter: Payer: Self-pay | Admitting: Gastroenterology

## 2022-03-19 VITALS — BP 124/60 | HR 56 | Ht 68.5 in | Wt 349.0 lb

## 2022-03-19 DIAGNOSIS — R197 Diarrhea, unspecified: Secondary | ICD-10-CM

## 2022-03-19 DIAGNOSIS — R112 Nausea with vomiting, unspecified: Secondary | ICD-10-CM | POA: Diagnosis not present

## 2022-03-19 NOTE — Progress Notes (Signed)
? ? ? ?03/19/2022 ?Hector Powell ?[id number] ?Feb 28, 1966 ? ? ?HISTORY OF PRESENT ILLNESS: This is a 56 year old male who is a patient Dr. Doyne Keel.  He was last seen here in July 2018 to discuss colon cancer screening.  He has never had colonoscopy.  Due to his obstructive sleep apnea and morbid obesity, being higher than average risk for anesthesia, he has opted for stool testing.  Most recently Cologuard in 09/2021 was negative.  Nonetheless, he presents here today as a referral from his PCP, Dr. Philip Aspen, to be evaluated in regards to recent symptoms of diarrhea and vomiting.  They tell me that he was in [place] at the end of March.  Developed diarrhea and what they described as sour burps.  Then 4 days later he had some vomiting.  He seemed to bounce back and recover from that but then had recurrence of the same symptoms on 4/10.  Once he recovered from that after several hours he persisted with some loose stools for a little while, but now has really had no symptoms and is back at baseline for at least the past couple of weeks.  He had stool culture, C. difficile, and O&P that were all negative. ? ? ?Past Medical History:  ?Diagnosis Date  ? Chest pain 2009  ? normal stress echo  ? Diabetes mellitus without complication ([place])   ? Dyslipidemia   ? GERD (gastroesophageal reflux disease)   ? Hypertension   ? Obesity, morbid ([place])   ? Obstructive sleep apnea   ? Seasonal allergies   ? ?Past Surgical History:  ?Procedure Laterality Date  ? CHOLECYSTECTOMY    ? NASAL SEPTUM SURGERY    ? OTHER SURGICAL HISTORY    ? tonsilectomy  ? OTHER SURGICAL HISTORY    ? s/p palato-uvuloplasty  ? ? reports that he has never smoked. He has never used smokeless tobacco. He reports that he does not drink alcohol and does not use drugs. ?family history includes Breast cancer in his mother; Diabetes in his brother; Lung cancer in his father. ?Allergies  ?Allergen Reactions  ? Amoxicillin Rash  ? ? ?  ?Outpatient Encounter  Medications as of 03/19/2022  ?Medication Sig  ? aspirin 81 MG tablet Take 81 mg by mouth daily.  ? famotidine (PEPCID) 40 MG tablet Take 40 mg by mouth daily.  ? fexofenadine (ALLEGRA) 180 MG tablet Take 180 mg by mouth daily.  ? JARDIANCE 10 MG TABS tablet Take 10 mg by mouth daily.  ? loratadine (CLARITIN) 10 MG tablet Take 10 mg by mouth daily.  ? meloxicam (MOBIC) 15 MG tablet meloxicam 15 mg tablet ? TAKE (1) TABLET BY MOUTH EVERY DAY AS NEEDED  ? metFORMIN (GLUCOPHAGE) 500 MG tablet Take 500 mg by mouth daily with breakfast.  ? niacin 500 MG tablet Take 500 mg by mouth daily with breakfast.  ? OZEMPIC, 1 MG/DOSE, 4 MG/3ML SOPN Inject into the skin.  ? saccharomyces boulardii (FLORASTOR) 250 MG capsule Take 250 mg by mouth 2 (two) times daily.  ? simvastatin (ZOCOR) 20 MG tablet Take 20 mg by mouth every evening.  ? [DISCONTINUED] lisinopril (ZESTRIL) 10 MG tablet   ? [DISCONTINUED] pantoprazole (PROTONIX) 40 MG tablet Take 40 mg by mouth daily.  ? ?No facility-administered encounter medications on file as of 03/19/2022.  ? ? ? ?REVIEW OF SYSTEMS  : All other systems reviewed and negative except where noted in the History of Present Illness. ? ? ?PHYSICAL EXAM: ?BP 124/60   Pulse Marland Kitchen)  56   Ht 5' 8.5" (1.74 m)   Wt (!) 349 lb (158.3 kg)   SpO2 96%   BMI 52.29 kg/m?  ?General: Well developed white male in no acute distress ?Head: Normocephalic and atraumatic ?Eyes:  Sclerae anicteric, conjunctiva pink. ?Ears: Normal auditory acuity ?Lungs: Clear throughout to auscultation; no W/R/R. ?Heart: Regular rate and rhythm; no M/R/G. ?Abdomen: Soft, non-distended.  BS present.  Non-tender. ?Musculoskeletal: Symmetrical with no gross deformities  ?Skin: No lesions on visible extremities ?Extremities: No edema  ?Neurological: Alert oriented x 4, grossly non-focal ?Psychological:  Alert and cooperative. Normal mood and affect ? ?ASSESSMENT AND PLAN: ?*56 year old male who had sudden onset diarrhea and vomiting at the end of  March.  Then had recurrence of symptoms on April 10.  Stool studies were negative for infectious source.  Since then he has continued to progressively improve and is about back at baseline without any symptoms for least a couple of weeks now.  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  I suspect that he will continue to feel well without recurrence, but will hold any other work-up unless he were to have recurrence of symptoms. ? ? ?CC:  Donnajean Lopes, MD ? ?  ?

## 2022-03-19 NOTE — Patient Instructions (Signed)
If you are age 55 or older, your body mass index should be between 23-30. Your Body mass index is 52.29 kg/m?Marland Kitchen If this is out of the aforementioned range listed, please consider follow up with your Primary Care Provider. ? ?If you are age 55 or younger, your body mass index should be between 19-25. Your Body mass index is 52.29 kg/m?Marland Kitchen If this is out of the aformentioned range listed, please consider follow up with your Primary Care Provider.  ? ?________________________________________________________ ? ?The [facility] providers would like to encourage you to use [facility] to communicate with providers for non-urgent requests or questions.  Due to long hold times on the telephone, sending your provider a message by [facility] may be a faster and more efficient way to get a response.  Please allow 48 business hours for a response.  Please remember that this is for non-urgent requests.  ?_______________________________________________________ ? ?Monitor symptoms as discussed with Shanda Bumps, PA ? ?It was a pleasure to see you today! ? ?Thank you for trusting me with your gastrointestinal care!   ? ? ?

## 2022-03-25 ENCOUNTER — Encounter: Payer: Self-pay | Admitting: Gastroenterology

## 2022-03-25 DIAGNOSIS — R197 Diarrhea, unspecified: Secondary | ICD-10-CM | POA: Insufficient documentation

## 2022-03-25 DIAGNOSIS — R112 Nausea with vomiting, unspecified: Secondary | ICD-10-CM | POA: Insufficient documentation

## 2022-03-26 NOTE — Progress Notes (Signed)
Agree with assessment and plan as outlined.  

## 2022-04-10 ENCOUNTER — Ambulatory Visit: Payer: BC Managed Care – PPO | Admitting: Gastroenterology

## 2022-10-15 ENCOUNTER — Other Ambulatory Visit: Payer: Self-pay

## 2022-10-15 MED ORDER — LAGEVRIO 200 MG PO CAPS
4.0000 | ORAL_CAPSULE | Freq: Two times a day (BID) | ORAL | 0 refills | Status: DC
  Filled 2022-10-15: qty 40, 5d supply, fill #0

## 2023-07-23 ENCOUNTER — Ambulatory Visit
Admission: EM | Admit: 2023-07-23 | Discharge: 2023-07-23 | Disposition: A | Discharge status: Home / Self Care | Payer: BC Managed Care – PPO

## 2023-07-23 DIAGNOSIS — T23222A Burn of second degree of single left finger (nail) except thumb, initial encounter: Secondary | ICD-10-CM

## 2023-07-23 NOTE — Discharge Instructions (Addendum)
Keep the wounds clean and dry and dressed with dry dressings and bacitracin until the blisters rupture and the underlying skin starts to heal.  If you develop any increased redness, pain, pus drainage, red streaks going up your hand, or fever you need to return for reevaluation.  Use over-the-counter ibuprofen, 3 tablets every 6 hours with food, as needed for pain and inflammation.
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
## 2023-09-20 IMAGING — CT CT ABD-PELV W/ CM
2 of 5 series · 16 of 46 positions shown, 18 images · IV contrast (APPLIED)
Comparison: None.

CLINICAL DATA: Left lower quadrant pain, nausea, vomiting and
diarrhea for 3 days

EXAM:
CT ABDOMEN AND PELVIS WITH CONTRAST
TECHNIQUE: Multidetector CT imaging of the abdomen and pelvis was performed
using the standard protocol following bolus administration of
intravenous contrast.

[Series 2: axial st · axial · 0.98mm/px · z∈[-798,-318]mm · 13 of 114 slices shown, 15 images]
[im 9/114  soft-tissue]
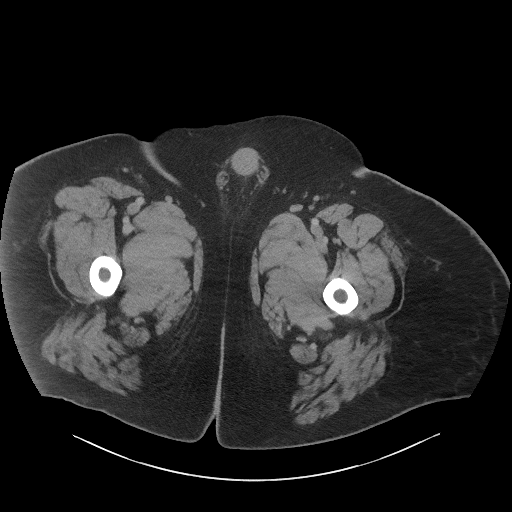
[im 9/114  bone]
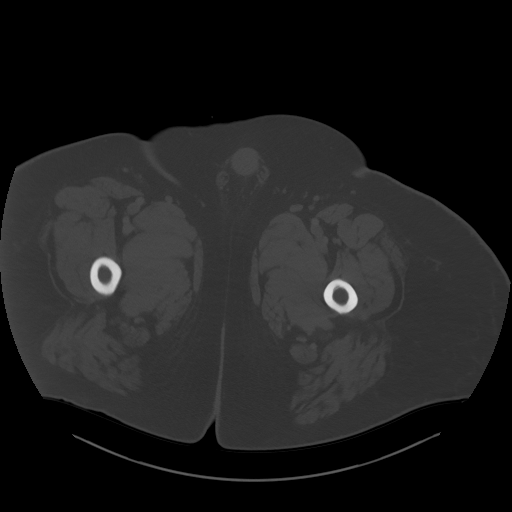
[im 17/114  soft-tissue]
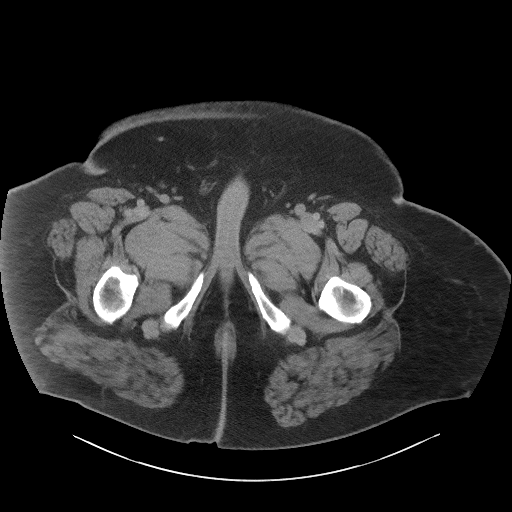
[im 25/114  soft-tissue]
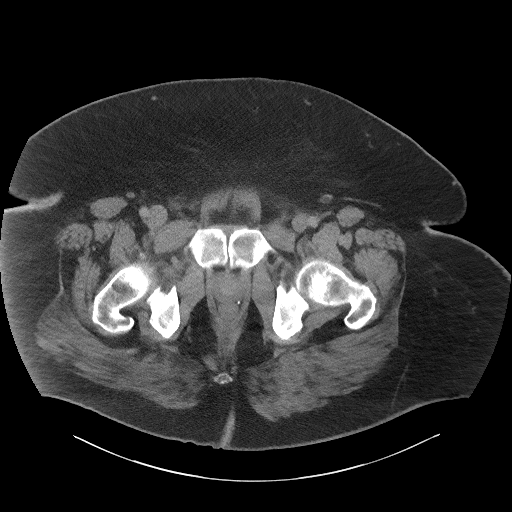
[im 33/114  soft-tissue]
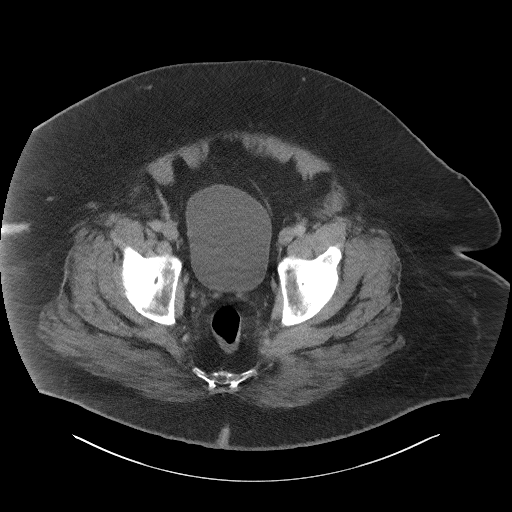
[im 41/114  soft-tissue]
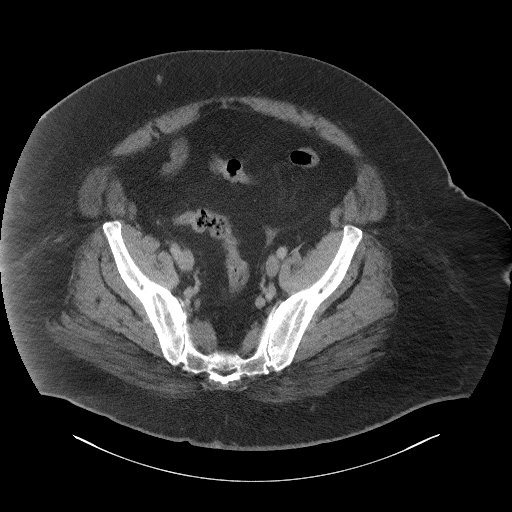
[im 49/114  soft-tissue]
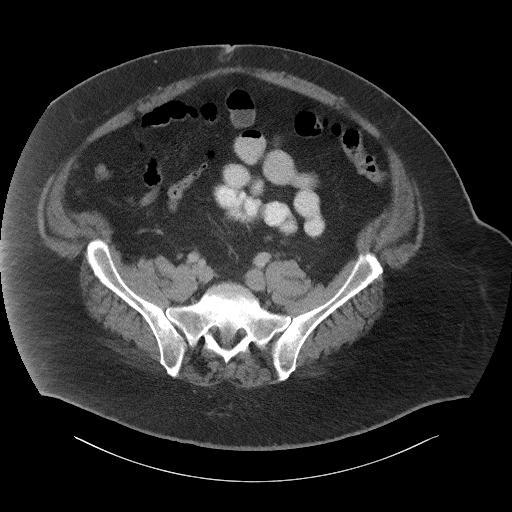
[im 57/114  soft-tissue]
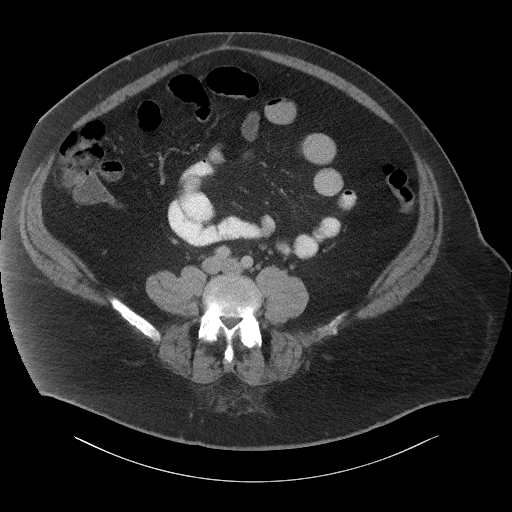
[im 65/114  soft-tissue]
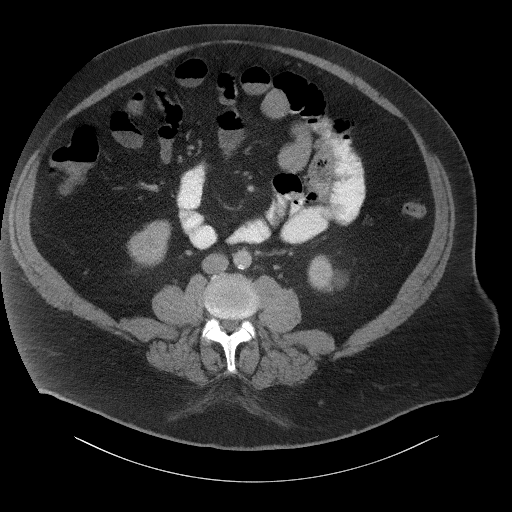
[im 73/114  soft-tissue]
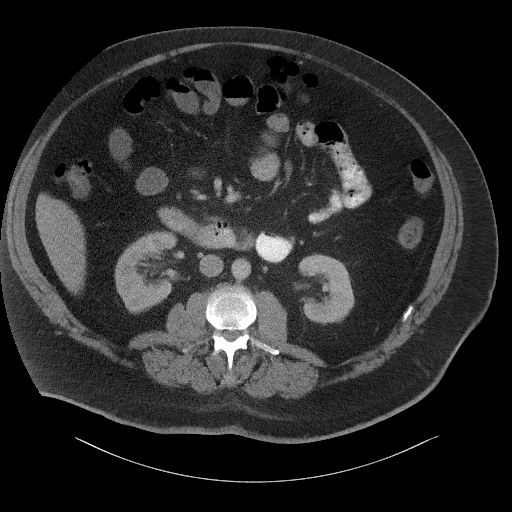
[im 73/114  bone]
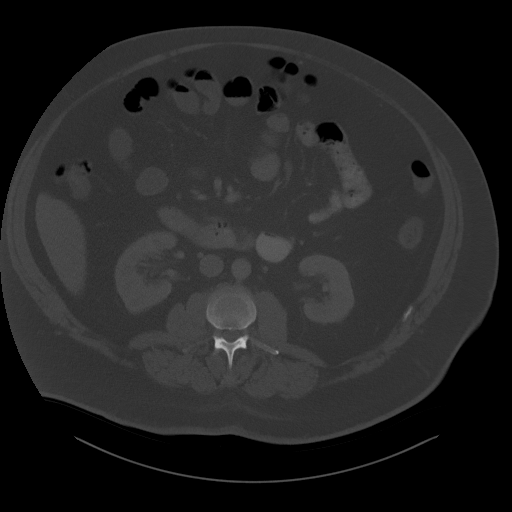
[im 81/114  soft-tissue]
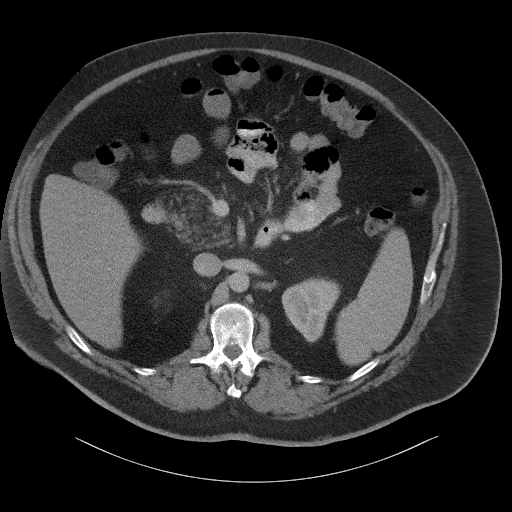
[im 89/114  soft-tissue]
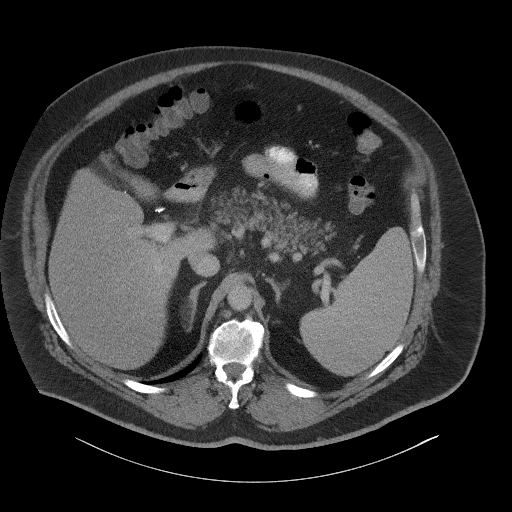
[im 97/114  soft-tissue]
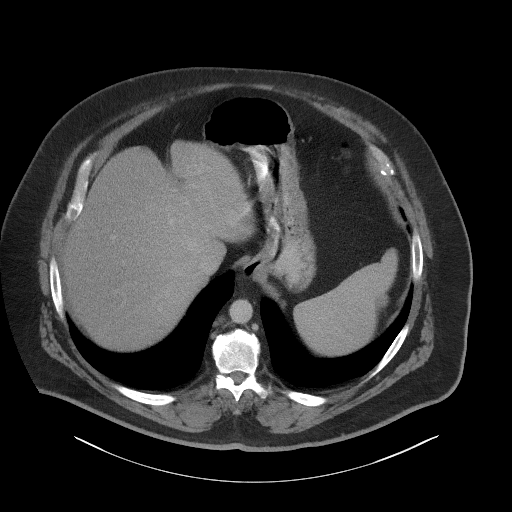
[im 105/114  soft-tissue]
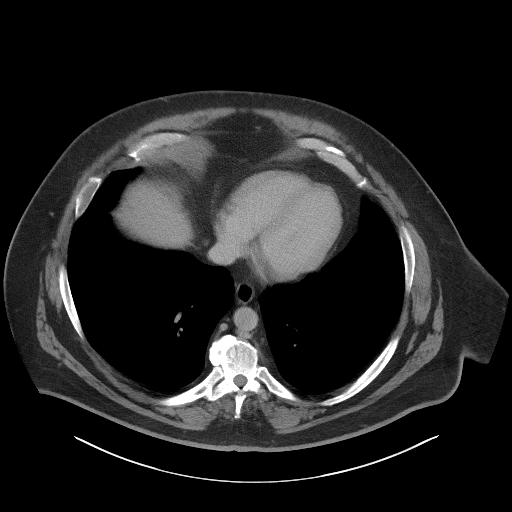

[Series 4: coronal st · coronal · 1.04mm/px · 3 of 133 slices shown]
[im 45/133  soft-tissue]
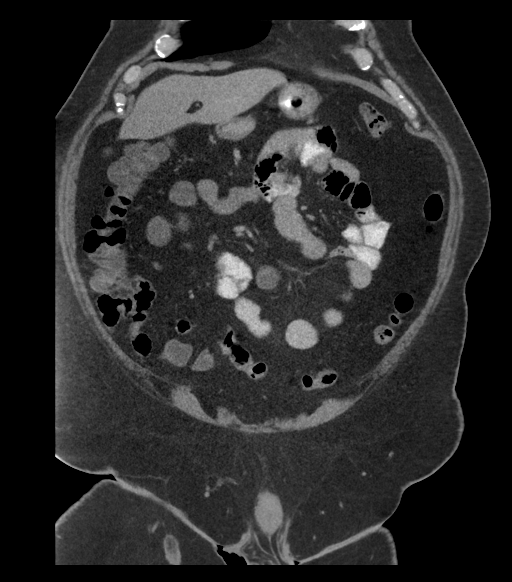
[im 59/133  soft-tissue]
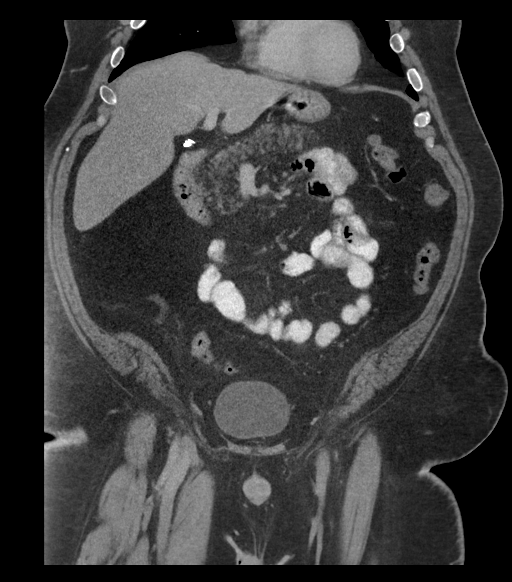
[im 74/133  soft-tissue]
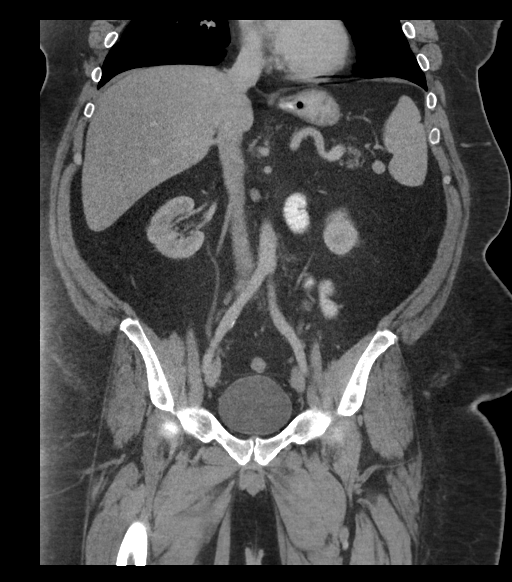

[16 of 46 positions shown; findings below may reference images not displayed]

RADIATION DOSE REDUCTION: This exam was performed according to the
departmental dose-optimization program which includes automated
exposure control, adjustment of the mA and/or kV according to
patient size and/or use of iterative reconstruction technique.

CONTRAST:  100mL OMNIPAQUE IOHEXOL 300 MG/ML  SOLN
FINDINGS: Lower chest: No acute abnormality.

Hepatobiliary: No focal liver abnormality is seen. Status post
cholecystectomy. No biliary dilatation.

Pancreas: Unremarkable. No pancreatic ductal dilatation or
surrounding inflammatory changes.

Spleen: Normal in size without focal abnormality.

Adrenals/Urinary Tract: Adrenal glands are normal. 3 cm simple cyst
at the lower pole of the left kidney does not require further
imaging follow-up. Kidneys, ureters, and bladder otherwise
unremarkable.

Stomach/Bowel: Stomach is within normal limits. Appendix appears
normal. No evidence of bowel wall thickening, distention, or
inflammatory changes.

Vascular/Lymphatic: Mildly prominent left external iliac chain lymph
node is not enlarged by imaging criteria measuring 9 mm in short
axis.

Reproductive: Prostate is unremarkable.

Other: No abdominal wall hernia or abnormality. No abdominopelvic
ascites.

Musculoskeletal: No acute or significant osseous findings.
IMPRESSION: No acute abnormality of the abdomen or pelvis.

## 2023-12-10 ENCOUNTER — Other Ambulatory Visit (HOSPITAL_COMMUNITY): Payer: Self-pay

## 2023-12-10 MED ORDER — ONDANSETRON 4 MG PO TBDP
4.0000 mg | ORAL_TABLET | Freq: Three times a day (TID) | ORAL | 0 refills | Status: AC | PRN
  Filled 2023-12-10: qty 30, 10d supply, fill #0

## 2023-12-10 MED ORDER — OSELTAMIVIR PHOSPHATE 75 MG PO CAPS
75.0000 mg | ORAL_CAPSULE | Freq: Two times a day (BID) | ORAL | 0 refills | Status: AC
  Filled 2023-12-10: qty 10, 5d supply, fill #0
